# Patient Record
Sex: Female | Born: 1945 | Race: White | Hispanic: No | Marital: Married | Smoking: Never smoker
Health system: Southern US, Community
[De-identification: ages and names within clinical notes are randomized; demographics above are authoritative.]

## PROBLEM LIST (undated history)

## (undated) DIAGNOSIS — M1990 Unspecified osteoarthritis, unspecified site: Secondary | ICD-10-CM

## (undated) DIAGNOSIS — I10 Essential (primary) hypertension: Secondary | ICD-10-CM

---

## 2005-08-15 ENCOUNTER — Ambulatory Visit: Payer: Self-pay | Admitting: Internal Medicine

## 2005-09-10 ENCOUNTER — Ambulatory Visit: Payer: Self-pay | Admitting: Internal Medicine

## 2006-05-01 ENCOUNTER — Ambulatory Visit: Payer: Self-pay | Admitting: Internal Medicine

## 2006-07-19 ENCOUNTER — Ambulatory Visit: Payer: Self-pay | Admitting: Internal Medicine

## 2007-01-15 ENCOUNTER — Ambulatory Visit: Payer: Self-pay | Admitting: Obstetrics and Gynecology

## 2008-02-04 ENCOUNTER — Ambulatory Visit: Payer: Self-pay | Admitting: Obstetrics and Gynecology

## 2009-07-27 ENCOUNTER — Ambulatory Visit: Payer: Self-pay | Admitting: Family Medicine

## 2010-08-02 ENCOUNTER — Ambulatory Visit: Payer: Self-pay | Admitting: Obstetrics and Gynecology

## 2012-05-28 ENCOUNTER — Ambulatory Visit: Payer: Self-pay | Admitting: Family Medicine

## 2014-07-12 ENCOUNTER — Ambulatory Visit: Payer: Self-pay | Admitting: Family Medicine

## 2016-02-20 ENCOUNTER — Other Ambulatory Visit: Payer: Self-pay | Admitting: Family Medicine

## 2016-02-20 DIAGNOSIS — Z780 Asymptomatic menopausal state: Secondary | ICD-10-CM

## 2016-04-04 ENCOUNTER — Other Ambulatory Visit: Payer: Self-pay | Admitting: Family Medicine

## 2016-04-04 DIAGNOSIS — Z1231 Encounter for screening mammogram for malignant neoplasm of breast: Secondary | ICD-10-CM

## 2016-04-24 ENCOUNTER — Ambulatory Visit
Admission: RE | Admit: 2016-04-24 | Discharge: 2016-04-24 | Disposition: A | Discharge status: Home / Self Care | Payer: Medicare Other | Source: Ambulatory Visit | Attending: Family Medicine | Admitting: Family Medicine

## 2016-04-24 DIAGNOSIS — Z780 Asymptomatic menopausal state: Secondary | ICD-10-CM | POA: Diagnosis present

## 2016-04-24 DIAGNOSIS — Z1231 Encounter for screening mammogram for malignant neoplasm of breast: Secondary | ICD-10-CM | POA: Insufficient documentation

## 2017-07-21 ENCOUNTER — Other Ambulatory Visit: Payer: Self-pay | Admitting: Family Medicine

## 2017-07-21 DIAGNOSIS — Z1231 Encounter for screening mammogram for malignant neoplasm of breast: Secondary | ICD-10-CM

## 2017-08-13 ENCOUNTER — Ambulatory Visit: Payer: Medicare Other

## 2017-08-14 ENCOUNTER — Ambulatory Visit
Admission: RE | Admit: 2017-08-14 | Discharge: 2017-08-14 | Disposition: A | Discharge status: Home / Self Care | Payer: Medicare Other | Source: Ambulatory Visit | Attending: Family Medicine | Admitting: Family Medicine

## 2017-08-14 DIAGNOSIS — Z1231 Encounter for screening mammogram for malignant neoplasm of breast: Secondary | ICD-10-CM | POA: Insufficient documentation

## 2018-06-30 ENCOUNTER — Other Ambulatory Visit: Payer: Self-pay | Admitting: Family Medicine

## 2018-06-30 DIAGNOSIS — Z1231 Encounter for screening mammogram for malignant neoplasm of breast: Secondary | ICD-10-CM

## 2018-08-18 ENCOUNTER — Ambulatory Visit: Payer: Medicare Other

## 2018-08-20 ENCOUNTER — Encounter (INDEPENDENT_AMBULATORY_CARE_PROVIDER_SITE_OTHER): Payer: Self-pay

## 2018-08-20 ENCOUNTER — Ambulatory Visit
Admission: RE | Admit: 2018-08-20 | Discharge: 2018-08-20 | Disposition: A | Discharge status: Home / Self Care | Payer: Medicare Other | Source: Ambulatory Visit | Attending: Family Medicine | Admitting: Family Medicine

## 2018-08-20 DIAGNOSIS — Z1231 Encounter for screening mammogram for malignant neoplasm of breast: Secondary | ICD-10-CM | POA: Insufficient documentation

## 2018-11-05 IMAGING — MG MM DIGITAL SCREENING BILAT W/ TOMO W/ CAD
8 series · 8 of 24 positions shown · non-contrast
Comparison: Previous exam(s).

CLINICAL DATA: Screening.

EXAM:
DIGITAL SCREENING BILATERAL MAMMOGRAM WITH TOMO AND CAD

[R CC synth-2D]
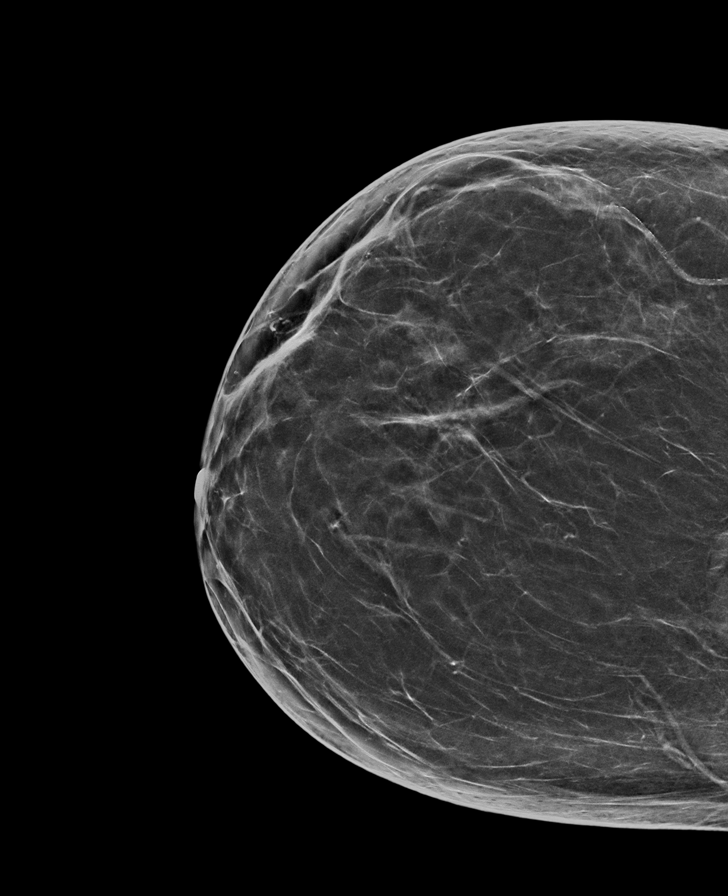

[L CC synth-2D]
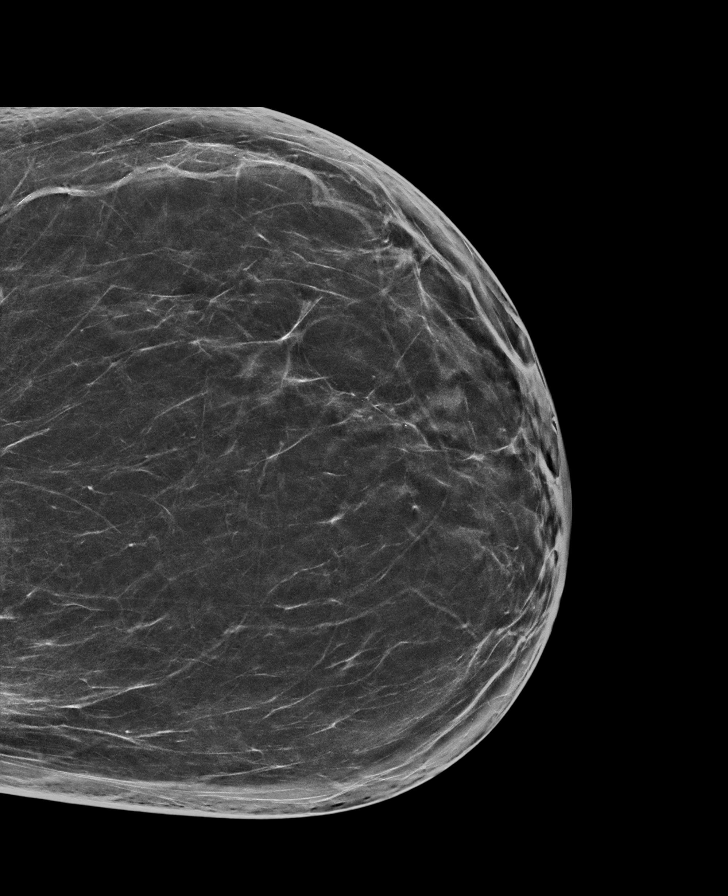

[L MLO synth-2D]
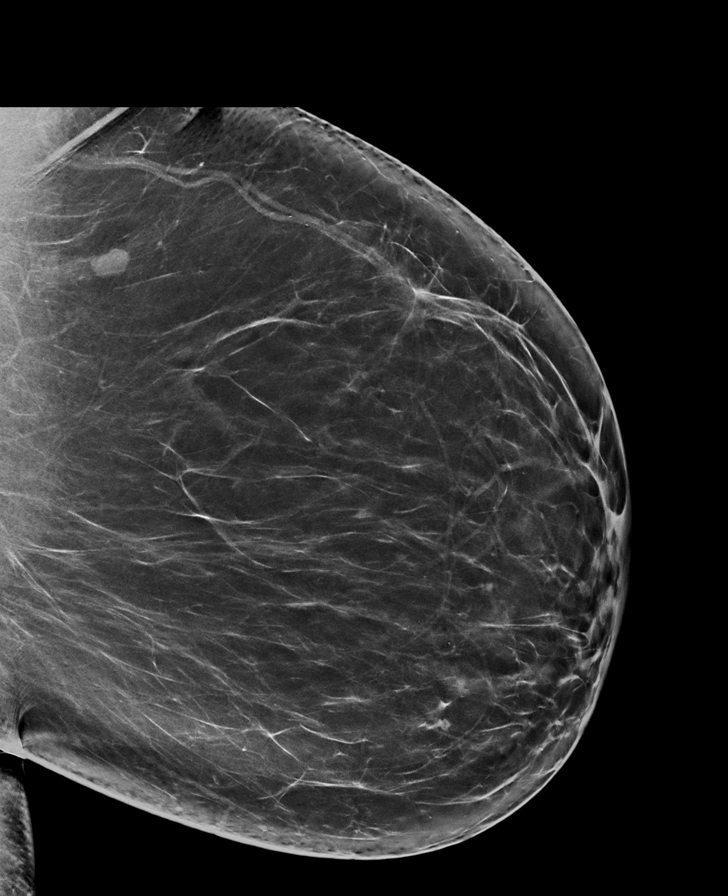

[R MLO synth-2D]
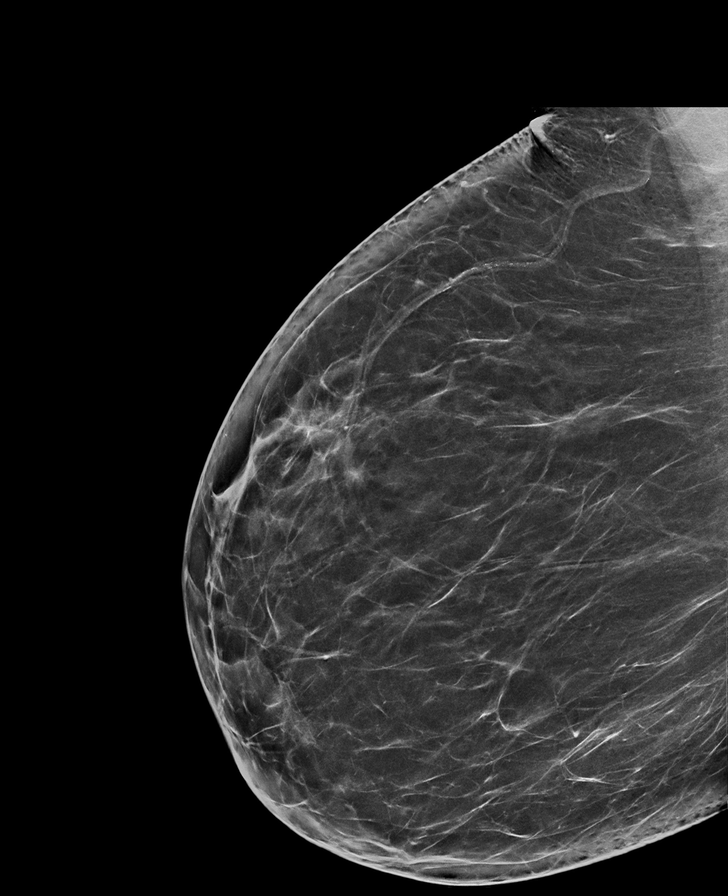

[R MLO tomo · tomo slice 43/84.0]
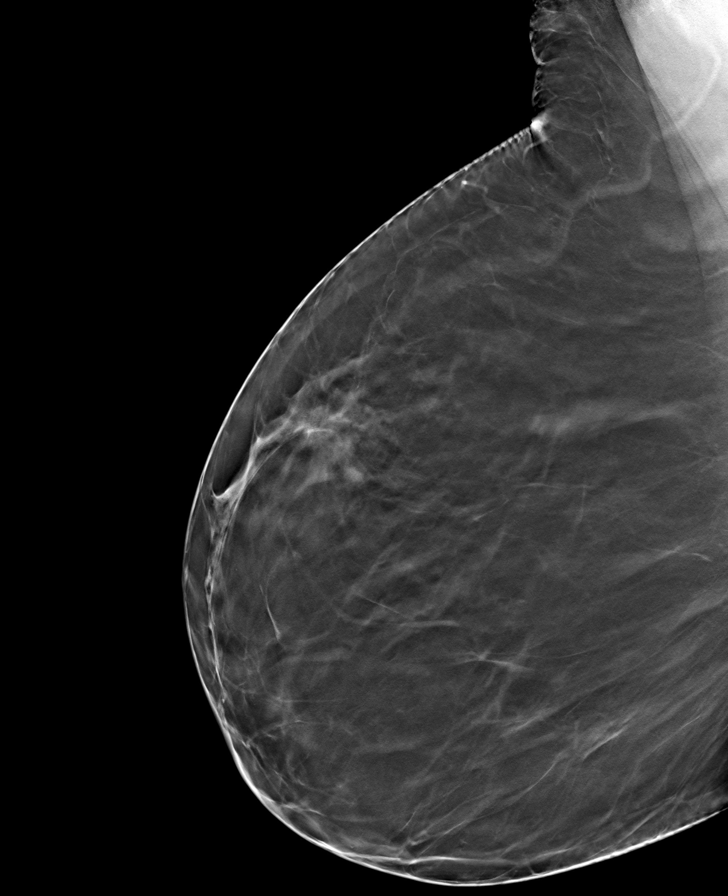

[L MLO tomo · tomo slice 45/89.0]
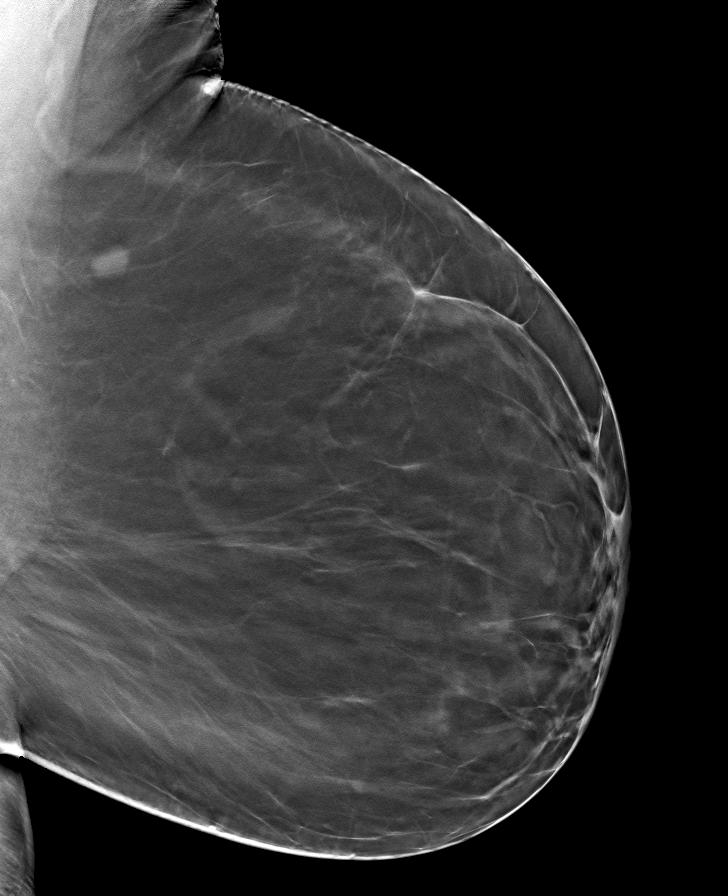

[L CC tomo · tomo slice 41/82.0]
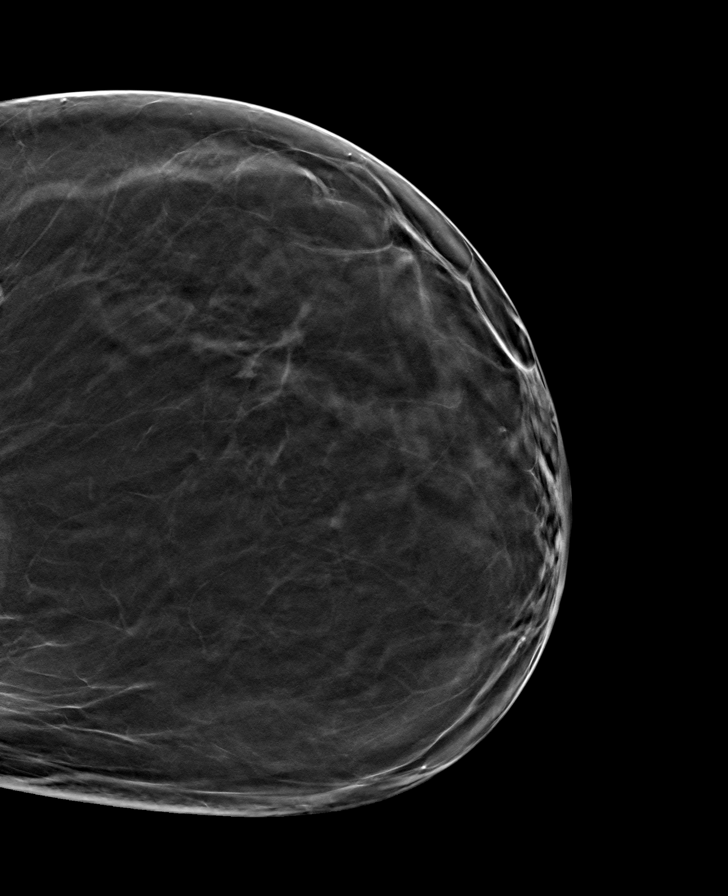

[R CC tomo · tomo slice 39/77.0]
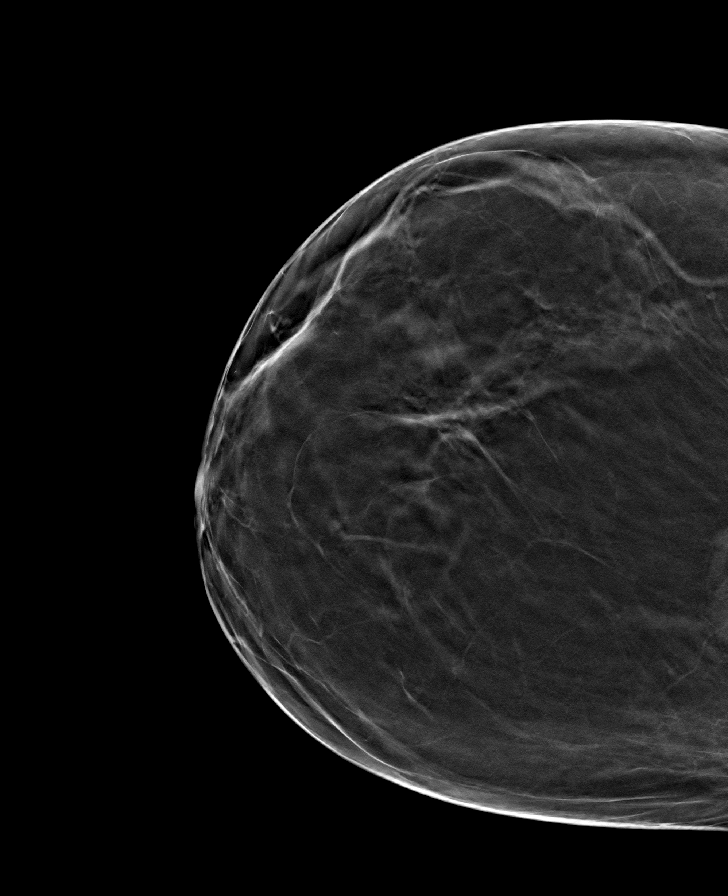

[8 of 24 positions shown; findings below may reference images not displayed]

ACR Breast Density Category b: There are scattered areas of
fibroglandular density.
FINDINGS: There are no findings suspicious for malignancy. Images were
processed with CAD.
IMPRESSION: No mammographic evidence of malignancy. A result letter of this
screening mammogram will be mailed directly to the patient.

RECOMMENDATION:
Screening mammogram in one year. (Code:[id number])

BI-RADS CATEGORY  1: Negative.

## 2019-11-08 ENCOUNTER — Other Ambulatory Visit: Payer: Self-pay | Admitting: Family Medicine

## 2019-11-08 DIAGNOSIS — Z1231 Encounter for screening mammogram for malignant neoplasm of breast: Secondary | ICD-10-CM

## 2019-11-17 ENCOUNTER — Ambulatory Visit
Admission: RE | Admit: 2019-11-17 | Discharge: 2019-11-17 | Disposition: A | Discharge status: Home / Self Care | Payer: Medicare Other | Source: Ambulatory Visit | Attending: Family Medicine | Admitting: Family Medicine

## 2019-11-17 ENCOUNTER — Encounter (INDEPENDENT_AMBULATORY_CARE_PROVIDER_SITE_OTHER): Payer: Self-pay

## 2019-11-17 ENCOUNTER — Other Ambulatory Visit: Payer: Self-pay

## 2019-11-17 DIAGNOSIS — Z1231 Encounter for screening mammogram for malignant neoplasm of breast: Secondary | ICD-10-CM | POA: Insufficient documentation

## 2020-10-25 ENCOUNTER — Other Ambulatory Visit: Payer: Self-pay | Admitting: Family Medicine

## 2020-10-25 DIAGNOSIS — Z1231 Encounter for screening mammogram for malignant neoplasm of breast: Secondary | ICD-10-CM

## 2020-11-20 ENCOUNTER — Other Ambulatory Visit: Payer: Self-pay

## 2020-11-20 ENCOUNTER — Ambulatory Visit
Admission: RE | Admit: 2020-11-20 | Discharge: 2020-11-20 | Disposition: A | Discharge status: Home / Self Care | Payer: Medicare Other | Source: Ambulatory Visit | Attending: Family Medicine | Admitting: Family Medicine

## 2020-11-20 DIAGNOSIS — Z1231 Encounter for screening mammogram for malignant neoplasm of breast: Secondary | ICD-10-CM | POA: Diagnosis present

## 2021-07-09 ENCOUNTER — Other Ambulatory Visit: Payer: Self-pay | Admitting: Family Medicine

## 2021-07-09 DIAGNOSIS — Z780 Asymptomatic menopausal state: Secondary | ICD-10-CM

## 2021-08-22 ENCOUNTER — Ambulatory Visit
Admission: RE | Admit: 2021-08-22 | Discharge: 2021-08-22 | Disposition: A | Discharge status: Home / Self Care | Payer: Medicare Other | Source: Ambulatory Visit | Attending: Family Medicine | Admitting: Family Medicine

## 2021-08-22 ENCOUNTER — Other Ambulatory Visit: Payer: Self-pay

## 2021-08-22 DIAGNOSIS — Z780 Asymptomatic menopausal state: Secondary | ICD-10-CM | POA: Diagnosis not present

## 2022-11-28 ENCOUNTER — Other Ambulatory Visit: Payer: Self-pay | Admitting: Family Medicine

## 2022-11-28 DIAGNOSIS — Z1231 Encounter for screening mammogram for malignant neoplasm of breast: Secondary | ICD-10-CM

## 2022-12-10 ENCOUNTER — Ambulatory Visit
Admission: EM | Admit: 2022-12-10 | Discharge: 2022-12-10 | Disposition: A | Discharge status: Home / Self Care | Payer: Medicare Other | Attending: Family Medicine | Admitting: Family Medicine

## 2022-12-10 ENCOUNTER — Other Ambulatory Visit: Payer: Self-pay

## 2022-12-10 DIAGNOSIS — I10 Essential (primary) hypertension: Secondary | ICD-10-CM | POA: Insufficient documentation

## 2022-12-10 DIAGNOSIS — J3489 Other specified disorders of nose and nasal sinuses: Secondary | ICD-10-CM | POA: Insufficient documentation

## 2022-12-10 DIAGNOSIS — Z1152 Encounter for screening for COVID-19: Secondary | ICD-10-CM | POA: Diagnosis not present

## 2022-12-10 DIAGNOSIS — R058 Other specified cough: Secondary | ICD-10-CM | POA: Diagnosis not present

## 2022-12-10 DIAGNOSIS — J069 Acute upper respiratory infection, unspecified: Secondary | ICD-10-CM | POA: Insufficient documentation

## 2022-12-10 HISTORY — DX: Essential (primary) hypertension: I10

## 2022-12-10 HISTORY — DX: Unspecified osteoarthritis, unspecified site: M19.90

## 2022-12-10 LAB — RESP PANEL BY RT-PCR (RSV, FLU A&B, COVID)  RVPGX2
Influenza A by PCR: NEGATIVE
Influenza B by PCR: NEGATIVE
Resp Syncytial Virus by PCR: NEGATIVE
SARS Coronavirus 2 by RT PCR: NEGATIVE

## 2022-12-10 NOTE — ED Triage Notes (Signed)
One day hx of nasal congestion, sneezing, mild cough, and sore throat x one day. On her way to [place] and wants to rule out viruses.

## 2022-12-10 NOTE — Discharge Instructions (Addendum)
Your symptoms today are most likely being caused by a virus and should steadily improve in time it can take up to 7 to 10 days before you truly start to see a turnaround however things will get better  COVID, flu and RSV negative     You can take Tylenol and/or Ibuprofen as needed for fever reduction and pain relief.   For cough: honey 1/2 to 1 teaspoon (you can dilute the honey in water or another fluid).  You can also use guaifenesin and dextromethorphan for cough. You can use a humidifier for chest congestion and cough.  If you don't have a humidifier, you can sit in the bathroom with the hot shower running.      For sore throat: try warm salt water gargles, cepacol lozenges, throat spray, warm tea or water with lemon/honey, popsicles or ice, or OTC cold relief medicine for throat discomfort.   For congestion: take a daily anti-histamine like Zyrtec, Claritin, and a oral decongestant, such as pseudoephedrine.  You can also use Flonase 1-2 sprays in each nostril daily.   It is important to stay hydrated: drink plenty of fluids (water, gatorade/powerade/pedialyte, juices, or teas) to keep your throat moisturized and help further relieve irritation/discomfort.

## 2022-12-10 NOTE — ED Provider Notes (Signed)
[facility]    CSN: [id number] Arrival date & time: 12/10/22  1016      History   Chief Complaint Chief Complaint  Patient presents with   Nasal Congestion   Cough   Sore Throat    HPI Lyndley Juvera is a 77 y.o. female.   Patient presents for evaluation of nasal congestion, rhinorrhea, sneezing, sore throat and a nonproductive cough present for 1 day.  Tolerating food and liquids.  Has not attempted treatment.  No known sick contacts.  Denies fever, chills, body aches, shortness of breath, wheezing.  Denies respiratory history.  Non-smoker.  Past Medical History:  Diagnosis Date   Arthritis    Hypertension     There are no problems to display for this patient.   History reviewed. No pertinent surgical history.  OB History   No obstetric history on file.      Home Medications    Prior to Admission medications   Medication Sig Start Date End Date Taking? Authorizing Provider  atorvastatin (LIPITOR) 10 MG tablet Take 1 tablet by mouth daily. 11/20/22  Yes [provider]  diclofenac (VOLTAREN) 75 MG EC tablet Take by mouth. 11/20/22  Yes [provider]  losartan-hydrochlorothiazide (HYZAAR) 50-12.5 MG tablet Take 1 tablet by mouth daily. 11/20/22 11/20/23 Yes [provider]    Family History Family History  Problem Relation Age of Onset   Breast cancer Paternal Aunt     Social History Social History   Tobacco Use   Smoking status: Never   Smokeless tobacco: Never  Vaping Use   Vaping Use: Never used  Substance Use Topics   Alcohol use: Not Currently   Drug use: Never     Allergies   Sulfa antibiotics   Review of Systems Review of Systems  Constitutional: Negative.   HENT:  Positive for congestion, rhinorrhea, sneezing and sore throat. Negative for dental problem, drooling, ear discharge, ear pain, facial swelling, hearing loss, mouth sores, nosebleeds, postnasal drip, sinus pressure, sinus pain,  tinnitus, trouble swallowing and voice change.   Respiratory:  Positive for cough. Negative for apnea, choking, chest tightness, shortness of breath, wheezing and stridor.   Cardiovascular: Negative.   Gastrointestinal: Negative.   Neurological: Negative.      Physical Exam Triage Vital Signs ED Triage Vitals  Enc Vitals Group     BP 12/10/22 1055 (!) 165/67     Pulse Rate 12/10/22 1055 65     Resp 12/10/22 1055 18     Temp 12/10/22 1055 98.1 F (36.7 C)     Temp Source 12/10/22 1055 Oral     SpO2 12/10/22 1055 99 %     Weight 12/10/22 1050 212 lb (96.2 kg)     Height 12/10/22 1050 5' 3"$  (1.6 m)     Head Circumference --      Peak Flow --      Pain Score 12/10/22 1050 3     Pain Loc --      Pain Edu? --      Excl. in [place]? --    No data found.  Updated Vital Signs BP (!) 165/67 (BP Location: Left Arm)   Pulse 65   Temp 98.1 F (36.7 C) (Oral)   Resp 18   Ht 5' 3"$  (1.6 m)   Wt 212 lb (96.2 kg)   SpO2 99%   BMI 37.55 kg/m   Visual Acuity Right Eye Distance:   Left Eye Distance:   Bilateral Distance:  Right Eye Near:   Left Eye Near:    Bilateral Near:     Physical Exam Constitutional:      Appearance: She is well-developed.  HENT:     Right Ear: Tympanic membrane and ear canal normal.     Left Ear: Tympanic membrane and ear canal normal.     Nose: Congestion and rhinorrhea present.     Mouth/Throat:     Mouth: Mucous membranes are moist.     Pharynx: No posterior oropharyngeal erythema.     Tonsils: No tonsillar exudate. 0 on the right. 0 on the left.  Cardiovascular:     Rate and Rhythm: Normal rate and regular rhythm.     Heart sounds: Normal heart sounds.  Pulmonary:     Effort: Pulmonary effort is normal.     Breath sounds: Normal breath sounds.  Skin:    General: Skin is warm.  Neurological:     General: No focal deficit present.     Mental Status: She is alert and oriented to person, place, and time.      UC Treatments / Results   Labs (all labs ordered are listed, but only abnormal results are displayed) Labs Reviewed  RESP PANEL BY RT-PCR (RSV, FLU A&B, COVID)  [id number]    EKG   Radiology No results found.  Procedures Procedures (including critical care time)  Medications Ordered in UC Medications - No data to display  Initial Impression / Assessment and Plan / UC Course  I have reviewed the triage vital signs and the nursing notes.  Pertinent labs & imaging results that were available during my care of the patient were reviewed by me and considered in my medical decision making (see chart for details).  Viral URI with cough  Patient is in no signs of distress nor toxic appearing.  Vital signs are stable.  Low suspicion for pneumonia, pneumothorax or bronchitis and therefore will defer imaging.  COVID, flu and RSV testing negative.May use additional over-the-counter medications as needed for supportive care.  May follow-up with urgent care as needed if symptoms persist or worsen.    Final Clinical Impressions(s) / UC Diagnoses   Final diagnoses:  None   Discharge Instructions   None    ED Prescriptions   None    PDMP not reviewed this encounter.   Hans Eden, [place] 12/10/22 1217

## 2022-12-24 ENCOUNTER — Ambulatory Visit
Admission: RE | Admit: 2022-12-24 | Discharge: 2022-12-24 | Disposition: A | Discharge status: Home / Self Care | Payer: Medicare Other | Source: Ambulatory Visit | Attending: Family Medicine | Admitting: Family Medicine

## 2022-12-24 DIAGNOSIS — Z1231 Encounter for screening mammogram for malignant neoplasm of breast: Secondary | ICD-10-CM | POA: Insufficient documentation

## 2023-04-09 ENCOUNTER — Ambulatory Visit
Admission: EM | Admit: 2023-04-09 | Discharge: 2023-04-09 | Disposition: A | Discharge status: Home / Self Care | Payer: Medicare Other | Attending: Family Medicine | Admitting: Family Medicine

## 2023-04-09 ENCOUNTER — Encounter: Payer: Self-pay | Admitting: Emergency Medicine

## 2023-04-09 DIAGNOSIS — W57XXXA Bitten or stung by nonvenomous insect and other nonvenomous arthropods, initial encounter: Secondary | ICD-10-CM | POA: Diagnosis not present

## 2023-04-09 DIAGNOSIS — R600 Localized edema: Secondary | ICD-10-CM | POA: Diagnosis not present

## 2023-04-09 DIAGNOSIS — S60561A Insect bite (nonvenomous) of right hand, initial encounter: Secondary | ICD-10-CM | POA: Diagnosis not present

## 2023-04-09 MED ORDER — CEPHALEXIN 500 MG PO CAPS: 500.0000 mg | ORAL_CAPSULE | Freq: Four times a day (QID) | ORAL | 0 refills | Status: AC

## 2023-04-09 MED ORDER — PREDNISONE 10 MG (21) PO TBPK: ORAL_TABLET | Freq: Every day | ORAL | 0 refills | Status: AC

## 2023-04-09 NOTE — Discharge Instructions (Signed)

## 2023-04-09 NOTE — ED Provider Notes (Signed)
[facility]    CSN: [id number] Arrival date & time: 04/09/23  1220      History   Chief Complaint Chief Complaint  Patient presents with   Insect Bite    HPI Marie Oneill is a 77 y.o. female.   HPI  Marie Oneill presents after a wasp stung her multiple times a couple days ago. Her right hand is red and swollen and leaking fluid. The wasp stung her right middle finger. She has had a similar reaction to wasp bites previously but not to this extent. She is right handed. Marie Oneill has otherwise been well and has no other concerns.     Past Medical History:  Diagnosis Date   Arthritis    Hypertension     There are no problems to display for this patient.   History reviewed. No pertinent surgical history.  OB History   No obstetric history on file.      Home Medications    Prior to Admission medications   Medication Sig Start Date End Date Taking? Authorizing Provider  cephALEXin (KEFLEX) 500 MG capsule Take 1 capsule (500 mg total) by mouth 4 (four) times daily. 04/09/23  Yes Khalin Royce, DO  predniSONE (STERAPRED UNI-PAK 21 TAB) 10 MG (21) TBPK tablet Take by mouth daily. Take 6 tabs by mouth daily for 1, then 5 tabs for 1 day, then 4 tabs for 1 day, then 3 tabs for 1 day, then 2 tabs for 1 day, then 1 tab for 1 day. 04/09/23  Yes Caroljean Monsivais, DO  atorvastatin (LIPITOR) 10 MG tablet Take 1 tablet by mouth daily. 11/20/22   [provider]  diclofenac (VOLTAREN) 75 MG EC tablet Take by mouth. 11/20/22   [provider]  losartan-hydrochlorothiazide (HYZAAR) 50-12.5 MG tablet Take 1 tablet by mouth daily. 11/20/22 11/20/23  [provider]    Family History Family History  Problem Relation Age of Onset   Breast cancer Paternal Aunt     Social History Social History   Tobacco Use   Smoking status: Never   Smokeless tobacco: Never  Vaping Use   Vaping Use: Never used  Substance Use Topics   Alcohol use: Not  Currently   Drug use: Never     Allergies   Sulfa antibiotics   Review of Systems Review of Systems :negative unless otherwise stated in HPI.      Physical Exam Triage Vital Signs ED Triage Vitals  Enc Vitals Group     BP 04/09/23 1327 (!) 145/83     Pulse Rate 04/09/23 1327 66     Resp 04/09/23 1327 18     Temp 04/09/23 1327 98.5 F (36.9 C)     Temp Source 04/09/23 1327 Oral     SpO2 04/09/23 1327 94 %     Weight --      Height --      Head Circumference --      Peak Flow --      Pain Score 04/09/23 1326 0     Pain Loc --      Pain Edu? --      Excl. in [place]? --    No data found.  Updated Vital Signs BP (!) 145/83 (BP Location: Left Arm)   Pulse 66   Temp 98.5 F (36.9 C) (Oral)   Resp 18   SpO2 94%   Visual Acuity Right Eye Distance:   Left Eye Distance:   Bilateral Distance:    Right Eye  Near:   Left Eye Near:    Bilateral Near:     Physical Exam  GEN: alert, well appearing female, in no acute distress  EYES: extra occular movements intact, no scleral injection CV: regular rate, strong radial pulse RESP: no increased work of breathing MSK: significant right hand edema, decreased ROM of phalanges due to edema  NEURO: alert, oriented at her baseline, fluent speech  SKIN: warm and dry; large hyperpigmented blister on her finger with erythema and edema (see image below)     UC Treatments / Results  Labs (all labs ordered are listed, but only abnormal results are displayed) Labs Reviewed - No data to display  EKG   Radiology No results found.  Procedures Procedures (including critical care time)  Medications Ordered in UC Medications - No data to display  Initial Impression / Assessment and Plan / UC Course  I have reviewed the triage vital signs and the nursing notes.  Pertinent labs & imaging results that were available during my care of the patient were reviewed by me and considered in my medical decision making (see chart for  details).     Patient is a 77 y.o. femalewho presents after multiple wasp stings.  Overall, patient is well-appearing and well-hydrated.  Vital signs stable.  Marie Oneill is afebrile. Treat with prednisone taper and Keflex as below. No signs of anaphylaxis.   Reviewed expectations regarding course of current medical issues.  All questions asked were answered.  Outlined signs and symptoms indicating need for more acute intervention. Patient verbalized understanding. After Visit Summary given.   Final Clinical Impressions(s) / UC Diagnoses   Final diagnoses:  Insect bite of right hand, initial encounter  Hand edema     Discharge Instructions      Stop by the pharmacy to pick up your prescriptions.  Follow up with your primary care provider as needed.  Go to ED for red flag symptoms, including; fevers you cannot reduce with Tylenol/Motrin, severe headaches, vision changes, numbness/weakness in part of the body, lethargy, confusion, intractable vomiting, severe dehydration, chest pain, breathing difficulty, severe persistent abdominal or pelvic pain, signs of severe infection (increased redness, swelling of an area), feeling faint or passing out, dizziness, etc. You should especially go to the ED for sudden acute worsening of condition if you do not elect to go at this time.      ED Prescriptions     Medication Sig Dispense Auth. Provider   cephALEXin (KEFLEX) 500 MG capsule Take 1 capsule (500 mg total) by mouth 4 (four) times daily. 20 capsule Makhari Dovidio, DO   predniSONE (STERAPRED UNI-PAK 21 TAB) 10 MG (21) TBPK tablet Take by mouth daily. Take 6 tabs by mouth daily for 1, then 5 tabs for 1 day, then 4 tabs for 1 day, then 3 tabs for 1 day, then 2 tabs for 1 day, then 1 tab for 1 day. 21 tablet Katha Cabal, DO      PDMP not reviewed this encounter.              Katha Cabal, DO 04/09/23 1352

## 2023-04-09 NOTE — ED Triage Notes (Signed)
Pt was stung by a wasp 2 days ago on her right middle finger.

## 2024-01-21 ENCOUNTER — Other Ambulatory Visit: Payer: Self-pay | Admitting: Family Medicine

## 2024-01-21 DIAGNOSIS — Z1231 Encounter for screening mammogram for malignant neoplasm of breast: Secondary | ICD-10-CM

## 2024-01-26 ENCOUNTER — Ambulatory Visit

## 2024-03-18 ENCOUNTER — Ambulatory Visit
Admission: RE | Admit: 2024-03-18 | Discharge: 2024-03-18 | Disposition: A | Discharge status: Home / Self Care | Source: Ambulatory Visit | Attending: Family Medicine | Admitting: Family Medicine

## 2024-03-18 DIAGNOSIS — Z1231 Encounter for screening mammogram for malignant neoplasm of breast: Secondary | ICD-10-CM | POA: Insufficient documentation
